# Patient Record
Sex: Male | Born: 1970 | Race: White | Hispanic: No
Health system: Southern US, Community
[De-identification: ages and names within clinical notes are randomized; demographics above are authoritative.]

## PROBLEM LIST (undated history)

## (undated) HISTORY — PX: OTHER SURGICAL HISTORY: SHX169

## (undated) HISTORY — PX: WISDOM TOOTH EXTRACTION: SHX21

## (undated) HISTORY — PX: BACK SURGERY: SHX140

---

## 2004-05-31 ENCOUNTER — Emergency Department: Payer: Self-pay | Admitting: Emergency Medicine

## 2008-04-01 ENCOUNTER — Emergency Department: Payer: Self-pay | Admitting: Internal Medicine

## 2009-08-16 ENCOUNTER — Emergency Department: Payer: Self-pay | Admitting: Emergency Medicine

## 2011-04-22 ENCOUNTER — Emergency Department: Payer: Self-pay | Admitting: Emergency Medicine

## 2018-07-20 ENCOUNTER — Emergency Department
Admission: EM | Admit: 2018-07-20 | Discharge: 2018-07-20 | Disposition: A | Discharge status: Home / Self Care | Payer: Self-pay | Attending: Emergency Medicine | Admitting: Emergency Medicine

## 2018-07-20 ENCOUNTER — Other Ambulatory Visit: Payer: Self-pay

## 2018-07-20 ENCOUNTER — Emergency Department: Payer: Self-pay

## 2018-07-20 DIAGNOSIS — K047 Periapical abscess without sinus: Secondary | ICD-10-CM | POA: Insufficient documentation

## 2018-07-20 DIAGNOSIS — M542 Cervicalgia: Secondary | ICD-10-CM | POA: Insufficient documentation

## 2018-07-20 DIAGNOSIS — S161XXA Strain of muscle, fascia and tendon at neck level, initial encounter: Secondary | ICD-10-CM

## 2018-07-20 LAB — CBC
HCT: 47.1 % (ref 39.0–52.0)
Hemoglobin: 15.9 g/dL (ref 13.0–17.0)
MCH: 29.1 pg (ref 26.0–34.0)
MCHC: 33.8 g/dL (ref 30.0–36.0)
MCV: 86.3 fL (ref 80.0–100.0)
Platelets: 290 10*3/uL (ref 150–400)
RBC: 5.46 MIL/uL (ref 4.22–5.81)
RDW: 12 % (ref 11.5–15.5)
WBC: 10.4 10*3/uL (ref 4.0–10.5)
nRBC: 0 % (ref 0.0–0.2)

## 2018-07-20 LAB — BASIC METABOLIC PANEL
Anion gap: 7 (ref 5–15)
BUN: 12 mg/dL (ref 6–20)
CO2: 30 mmol/L (ref 22–32)
Calcium: 9.2 mg/dL (ref 8.9–10.3)
Chloride: 99 mmol/L (ref 98–111)
Creatinine, Ser: 0.9 mg/dL (ref 0.61–1.24)
Glucose, Bld: 133 mg/dL — ABNORMAL HIGH (ref 70–99)
POTASSIUM: 4.4 mmol/L (ref 3.5–5.1)
Sodium: 136 mmol/L (ref 135–145)

## 2018-07-20 LAB — TROPONIN I

## 2018-07-20 MED ORDER — IBUPROFEN 800 MG PO TABS: 800.0000 mg | ORAL_TABLET | Freq: Three times a day (TID) | ORAL | 0 refills | Status: DC | PRN

## 2018-07-20 MED ORDER — CYCLOBENZAPRINE HCL 10 MG PO TABS: 10.0000 mg | ORAL_TABLET | Freq: Three times a day (TID) | ORAL | 0 refills | Status: DC | PRN

## 2018-07-20 MED ORDER — PENICILLIN V POTASSIUM 500 MG PO TABS
500.0000 mg | ORAL_TABLET | Freq: Once | ORAL | Status: AC
  Administered 2018-07-20: 500 mg via ORAL
  Filled 2018-07-20 (×2): qty 1

## 2018-07-20 MED ORDER — CYCLOBENZAPRINE HCL 10 MG PO TABS
10.0000 mg | ORAL_TABLET | Freq: Once | ORAL | Status: AC
  Administered 2018-07-20: 10 mg via ORAL
  Filled 2018-07-20: qty 1

## 2018-07-20 MED ORDER — IBUPROFEN 800 MG PO TABS
800.0000 mg | ORAL_TABLET | Freq: Once | ORAL | Status: AC
  Administered 2018-07-20: 800 mg via ORAL
  Filled 2018-07-20: qty 1

## 2018-07-20 MED ORDER — SODIUM CHLORIDE 0.9% FLUSH: 3.0000 mL | Freq: Once | INTRAVENOUS | Status: DC

## 2018-07-20 MED ORDER — PENICILLIN V POTASSIUM 500 MG PO TABS: 500.0000 mg | ORAL_TABLET | Freq: Four times a day (QID) | ORAL | 0 refills | Status: DC

## 2018-07-20 NOTE — ED Triage Notes (Addendum)
Pt comes in via POV from home with c/o jaw and neck pain that has been ongoing for 10 days. Pt states it got to hurting worse today  Pt states swelling noted to left side. Pt unsure if it is a tooth or not. Pt denies any bad teeth. Pt states he thinks it is what's causing the neck pain also.  Pt denies any chest pain, SOB. Pt also states weakness, disorientation, not eating and some nausea.

## 2018-07-20 NOTE — ED Provider Notes (Signed)
[facility] Emergency Department Provider Note       Time seen: ----------------------------------------- 2:13 PM on 07/20/2018 -----------------------------------------   I have reviewed the triage vital signs and the nursing notes.  HISTORY   Chief Complaint Neck Pain and Jaw Pain    HPI Peter Rivers is a 48 y.o. male with a history of back surgery, dental caries who presents to the ED for jaw and neck pain is been going on for 10 days.  Patient states he got to hurting worse today so he decided to come to the ER for evaluation.  Swelling is noted to the left side.  He denies chest pain, shortness of breath or other complaints to me.  History reviewed. No pertinent past medical history.  There are no active problems to display for this patient.   Past Surgical History:  Procedure Laterality Date  . BACK SURGERY    . cyst on left wrist    . WISDOM TOOTH EXTRACTION      Allergies Patient has no allergy information on record.  Social History Social History   Tobacco Use  . Smoking status: Not on file  Substance Use Topics  . Alcohol use: Not on file  . Drug use: Not on file   Review of Systems Constitutional: Negative for fever. ENT: Positive for jaw swelling, neck pain Cardiovascular: Negative for chest pain. Respiratory: Negative for shortness of breath. Gastrointestinal: Negative for abdominal pain, vomiting and diarrhea. Musculoskeletal: Negative for back pain. Skin: Negative for rash. Neurological: Negative for headaches, focal weakness or numbness.  All systems negative/normal/unremarkable except as stated in the HPI  ____________________________________________   PHYSICAL EXAM:  VITAL SIGNS: ED Triage Vitals  Enc Vitals Group     BP 07/20/18 1208 (!) 157/97     Pulse Rate 07/20/18 1208 70     Resp 07/20/18 1208 18     Temp 07/20/18 1208 98.1 F (36.7 C)     Temp src --      SpO2 07/20/18 1208 99 %     Weight  07/20/18 1205 185 lb (83.9 kg)     Height 07/20/18 1205 6\' 2"  (1.88 m)     Head Circumference --      Peak Flow --      Pain Score 07/20/18 1205 7     Pain Loc --      Pain Edu? --      Excl. in GC? --    Constitutional: Alert and oriented. Well appearing and in no distress. ENT      Head: Normocephalic and atraumatic.      Nose: No congestion/rhinnorhea.      Mouth/Throat: Mucous membranes are moist.  Obvious left mid perimandibular swelling and tenderness      Neck: No stridor.  No obvious adenopathy Cardiovascular: Normal rate, regular rhythm. No murmurs, rubs, or gallops. Respiratory: Normal respiratory effort without tachypnea nor retractions. Breath sounds are clear and equal bilaterally. No wheezes/rales/rhonchi. Musculoskeletal: Nontender with normal range of motion in extremities. No lower extremity tenderness nor edema. Neurologic:  Normal speech and language. No gross focal neurologic deficits are appreciated.  Skin:  Skin is warm, dry and intact. No rash noted. Psychiatric: Mood and affect are normal. Speech and behavior are normal.  ____________________________________________  EKG: Interpreted by me.  Sinus rhythm the rate of 74 bpm, normal PR interval, normal QRS, normal QT  ____________________________________________  ED COURSE:  As part of my medical decision making, I reviewed the following data  within the electronic MEDICAL RECORD NUMBER History obtained from family if available, nursing notes, old chart and ekg, as well as notes from prior ED visits. Patient presented for neck and jaw pain, we will assess with labs and imaging as indicated at this time.   Procedures ____________________________________________   LABS (pertinent positives/negatives)  Labs Reviewed  BASIC METABOLIC PANEL - Abnormal; Notable for the following components:      Result Value   Glucose, Bld 133 (*)    All other components within normal limits  CBC  TROPONIN I     RADIOLOGY  X-ray is unremarkable  ____________________________________________   DIFFERENTIAL DIAGNOSIS   Dental abscess, strain, spasm, degenerative disc disease  FINAL ASSESSMENT AND PLAN  Dental abscess, neck pain   Plan: The patient had presented for dental abscess with neck pain of uncertain etiology. Patient's labs are reassuring. Patient's imaging also reassuring.  He be discharged with Motrin, Flexeril and penicillin.  He be encouraged to follow-up with dentistry.   Ulice DashJohnathan E Williams, MD    Note: This note was generated in part or whole with voice recognition software. Voice recognition is usually quite accurate but there are transcription errors that can and very often do occur. I apologize for any typographical errors that were not detected and corrected.     Emily FilbertWilliams, Jonathan E, MD 07/20/18 1415

## 2020-08-13 IMAGING — CR DG CHEST 2V
1 series · 2 of 2 positions shown · non-contrast
Comparison: None.

CLINICAL DATA: Left-sided jaw pain for several days

EXAM:
CHEST - 2 VIEW

[Series 1: dg chest 2 view · 0.14mm/px · 2 of 2 slices shown]
[im 1/2]
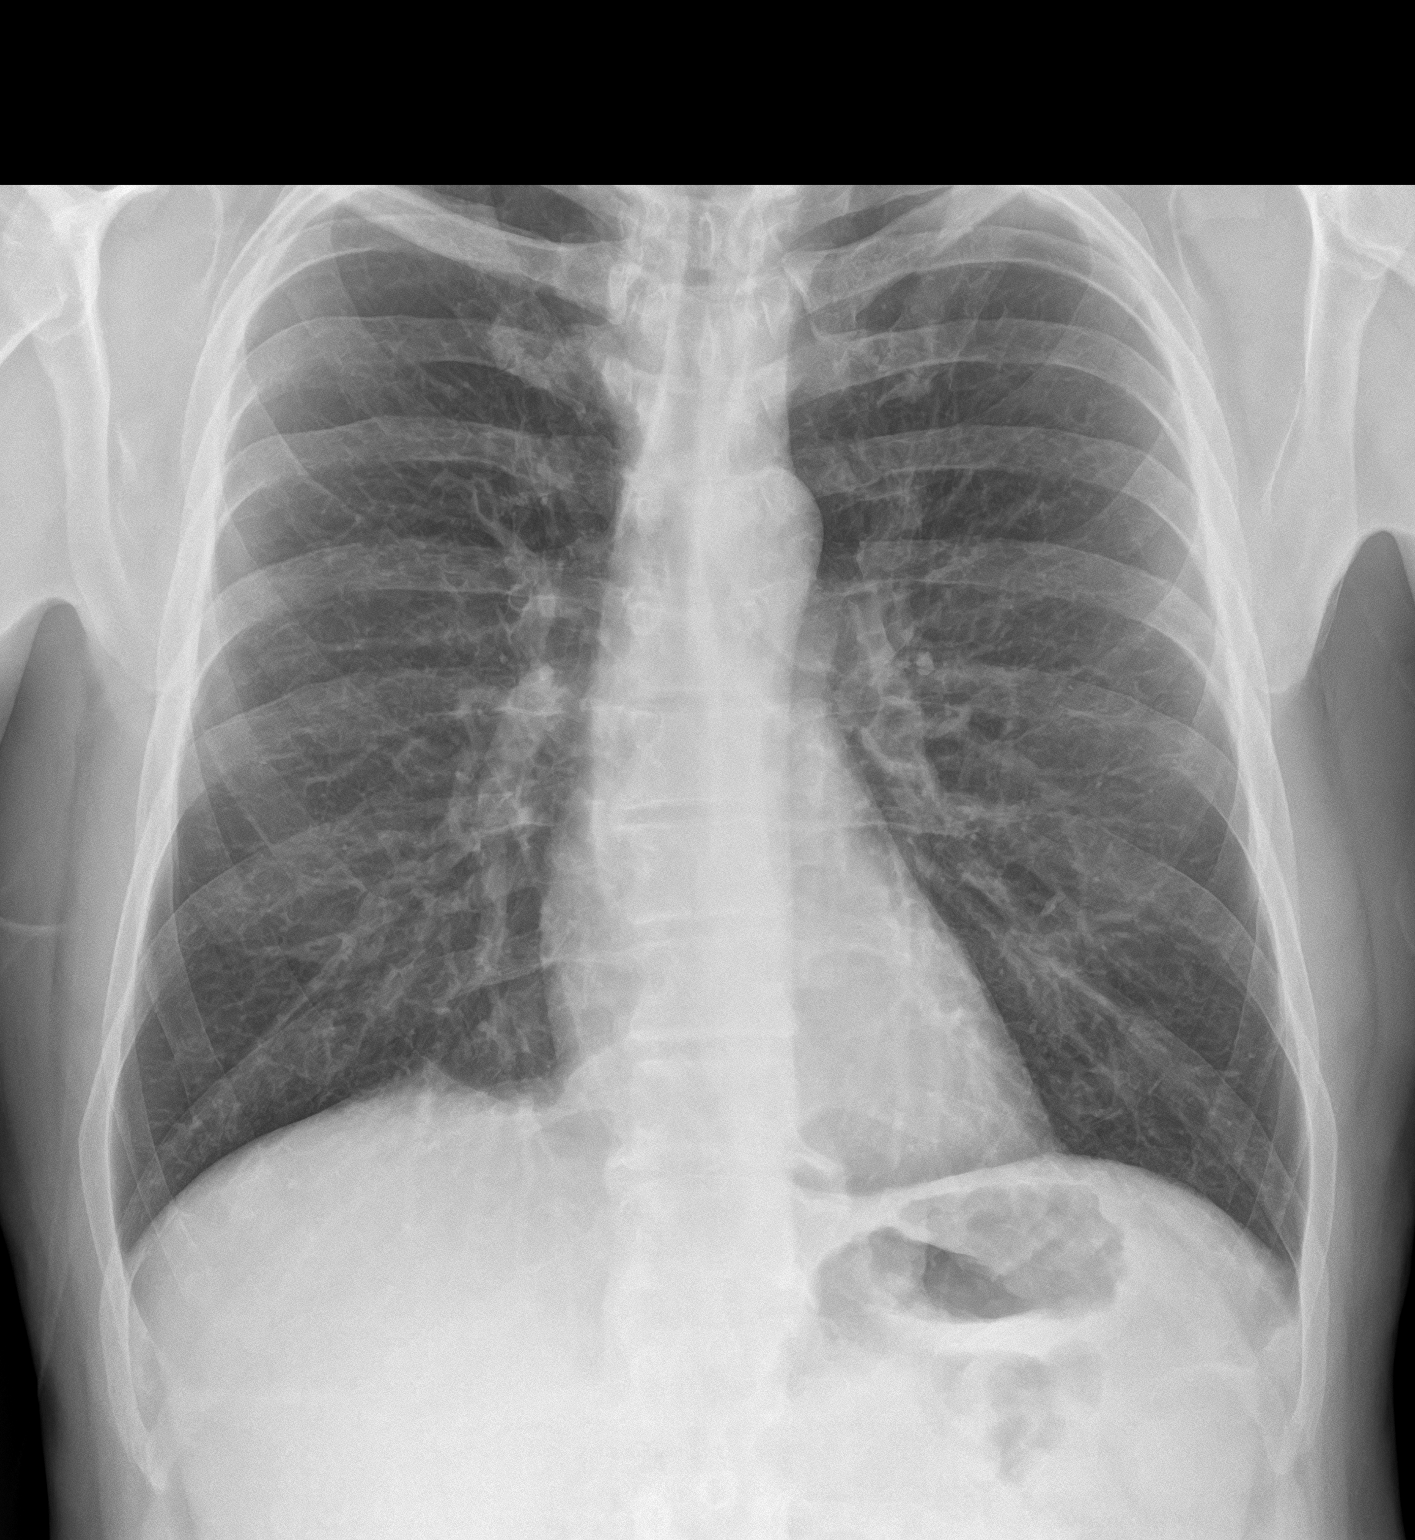
[im 2/2]
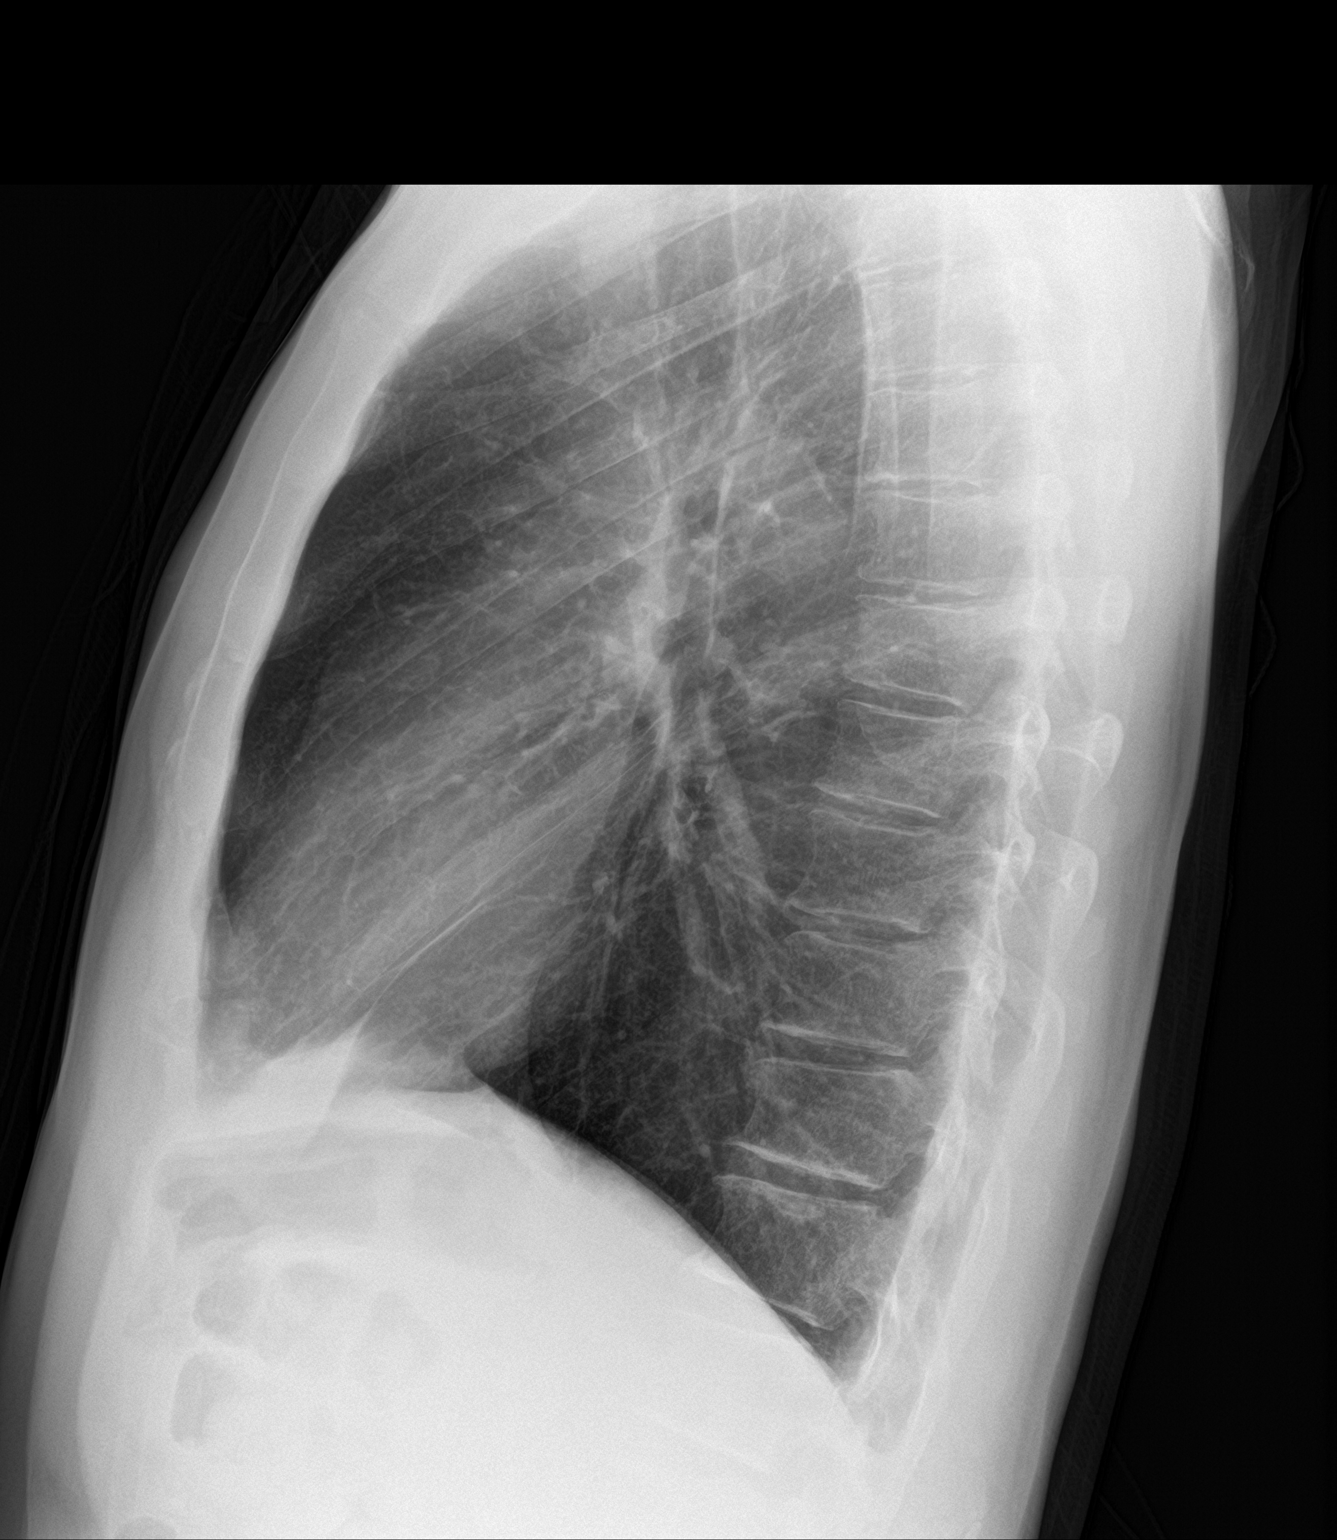

[2 of 2 positions shown; findings below may reference images not displayed]

FINDINGS: The heart size and mediastinal contours are within normal limits.
Both lungs are clear. The visualized skeletal structures are
unremarkable.
IMPRESSION: No active cardiopulmonary disease.

## 2021-11-23 ENCOUNTER — Other Ambulatory Visit: Payer: Self-pay

## 2021-11-23 ENCOUNTER — Emergency Department
Admission: EM | Admit: 2021-11-23 | Discharge: 2021-11-23 | Disposition: A | Discharge status: Home / Self Care | Payer: Self-pay | Attending: Emergency Medicine | Admitting: Emergency Medicine

## 2021-11-23 DIAGNOSIS — A64 Unspecified sexually transmitted disease: Secondary | ICD-10-CM | POA: Insufficient documentation

## 2021-11-23 LAB — URINALYSIS, COMPLETE (UACMP) WITH MICROSCOPIC
Bilirubin Urine: NEGATIVE
Glucose, UA: NEGATIVE mg/dL
Ketones, ur: NEGATIVE mg/dL
Nitrite: NEGATIVE
Protein, ur: NEGATIVE mg/dL
Specific Gravity, Urine: 1.008 (ref 1.005–1.030)
WBC, UA: >50 WBC/hpf — ABNORMAL HIGH (ref 0–5)
pH: 8 (ref 5.0–8.0)

## 2021-11-23 LAB — CHLAMYDIA/NGC RT PCR (ARMC ONLY)
Chlamydia Tr: NOT DETECTED
N gonorrhoeae: NOT DETECTED

## 2021-11-23 MED ORDER — AZITHROMYCIN 500 MG PO TABS
1000.0000 mg | ORAL_TABLET | Freq: Once | ORAL | Status: AC
  Administered 2021-11-23: 1000 mg via ORAL
  Filled 2021-11-23: qty 2

## 2021-11-23 MED ORDER — METRONIDAZOLE 500 MG PO TABS
2000.0000 mg | ORAL_TABLET | Freq: Once | ORAL | Status: AC
  Administered 2021-11-23: 2000 mg via ORAL
  Filled 2021-11-23: qty 4

## 2021-11-23 MED ORDER — CEFTRIAXONE SODIUM 1 G IJ SOLR
500.0000 mg | Freq: Once | INTRAMUSCULAR | Status: AC
  Administered 2021-11-23: 500 mg via INTRAMUSCULAR
  Filled 2021-11-23: qty 10

## 2021-11-23 MED ORDER — LIDOCAINE HCL (PF) 1 % IJ SOLN
5.0000 mL | Freq: Once | INTRAMUSCULAR | Status: AC
  Administered 2021-11-23: 5 mL
  Filled 2021-11-23: qty 5

## 2021-11-23 NOTE — ED Triage Notes (Signed)
Pt states that he has been having mucous in his urine for the past 3 days, denies pain with urination, reports recent separation from his wife

## 2021-11-23 NOTE — Discharge Instructions (Signed)
You are being treated for trichomoniasis, gonorrhea and chlamydia. You should not engage in unprotected sex with any partners, until all have been treated and are symptom free. You may follow-up with the [facility] for further testing and treatment.

## 2021-11-23 NOTE — ED Provider Triage Note (Signed)
Emergency Medicine Provider Triage Evaluation Note  Peter Rivers , a 51 y.o. male  was evaluated in triage.  Pt complains of penile discharge.  Describes mucus in his urine.  Not having any abdominal pain or fevers.  No testicular pain  Review of Systems  Positive: Fetal discharge Negative: Scrotal pain, fevers  Physical Exam  BP (!) 144/83 (BP Location: Left Arm)   Pulse 79   Temp 98.2 F (36.8 C) (Oral)   Resp 16   Ht 6\' 2"  (1.88 m)   Wt 81.6 kg   SpO2 98%   BMI 23.11 kg/m  Gen:   Awake, no distress   Resp:  Normal effort  MSK:   Moves extremities without difficulty  Other:    Medical Decision Making  Medically screening exam initiated at 5:10 PM.  Appropriate orders placed.  was informed that the remainder of the evaluation will be completed by another provider, this initial triage assessment does not replace that evaluation, and the importance of remaining in the ED until their evaluation is complete.     Laverle Patter, PA-C 11/23/21 1711

## 2021-11-23 NOTE — ED Provider Notes (Signed)
[facility] Emergency Department Provider Note     Event Date/Time   First MD Initiated Contact with Patient 11/23/21 1803     (approximate)   History   Penile Discharge   HPI  Peter Rivers is a 51 y.o. male with a noncontributory medical history, presents to the ED for 3 days of penile discharge.  Patient reports that yellow mucoid discharge from the penis. He denies any blood in urine or retention. He also denies any penile or scrotal lesions. He endorses unprotected anal, oral and vaginal intercourse, with a new partner. He denies FCS, NVD.     Physical Exam   Triage Vital Signs: ED Triage Vitals  Enc Vitals Group     BP 11/23/21 1707 (!) 144/83     Pulse Rate 11/23/21 1707 79     Resp 11/23/21 1707 16     Temp 11/23/21 1707 98.2 F (36.8 C)     Temp Source 11/23/21 1707 Oral     SpO2 11/23/21 1707 98 %     Weight 11/23/21 1709 180 lb (81.6 kg)     Height 11/23/21 1709 6\' 2"  (1.88 m)     Head Circumference --      Peak Flow --      Pain Score 11/23/21 1709 0     Pain Loc --      Pain Edu? --      Excl. in GC? --     Most recent vital signs: Vitals:   11/23/21 1707  BP: (!) 144/83  Pulse: 79  Resp: 16  Temp: 98.2 F (36.8 C)  SpO2: 98%    General Awake, no distress.  CV:  Good peripheral perfusion.  RESP:  Normal effort.  ABD:  No distention.  GU:  deferred   ED Results / Procedures / Treatments   Labs (all labs ordered are listed, but only abnormal results are displayed) Labs Reviewed  URINALYSIS, COMPLETE (UACMP) WITH MICROSCOPIC - Abnormal; Notable for the following components:      Result Value   Color, Urine YELLOW (*)    APPearance CLOUDY (*)    Hgb urine dipstick SMALL (*)    Leukocytes,Ua LARGE (*)    WBC, UA >50 (*)    Bacteria, UA RARE (*)    All other components within normal limits  CHLAMYDIA/NGC RT PCR ([facility] ONLY)               EKG    RADIOLOGY    No results  found.   PROCEDURES:  Critical Care performed: No  Procedures   MEDICATIONS ORDERED IN ED: Medications  metroNIDAZOLE (FLAGYL) tablet 2,000 mg (2,000 mg Oral Given 11/23/21 1841)  cefTRIAXone (ROCEPHIN) injection 500 mg (500 mg Intramuscular Given 11/23/21 1841)  azithromycin (ZITHROMAX) tablet 1,000 mg (1,000 mg Oral Given 11/23/21 1841)  lidocaine (PF) (XYLOCAINE) 1 % injection 5 mL (5 mLs Infiltration Given 11/23/21 1844)     IMPRESSION / MDM / ASSESSMENT AND PLAN / ED COURSE  I reviewed the triage vital signs and the nursing notes.                              Differential diagnosis includes, but is not limited to, gonorrhea, chlamydia, trichomoniasis, NGU, UTI  Patient to the ED for evaluation of penile discharge after admitting to after intercourse related to new partners.  Patient's diagnosis is consistent with STD exposure. Patient will be  treated empirically for gonorrhea chlamydia and trichomoniasis in the ED. Patient is to follow up with [facility], health department as needed or otherwise directed. Patient is given ED precautions to return to the ED for any worsening or new symptoms.   Patient's presentation is most consistent with acute, uncomplicated illness.  FINAL CLINICAL IMPRESSION(S) / ED DIAGNOSES   Final diagnoses:  Sexually transmitted disease (STD)     Rx / DC Orders   ED Discharge Orders     None        Note:  This document was prepared using Dragon voice recognition software and may include unintentional dictation errors.    Lissa Hoard, PA-C 11/23/21 1858    Delton Prairie, MD 11/23/21 [phone]

## 2022-05-25 DIAGNOSIS — Z419 Encounter for procedure for purposes other than remedying health state, unspecified: Secondary | ICD-10-CM | POA: Diagnosis not present

## 2022-06-25 DIAGNOSIS — Z419 Encounter for procedure for purposes other than remedying health state, unspecified: Secondary | ICD-10-CM | POA: Diagnosis not present

## 2022-07-26 DIAGNOSIS — Z419 Encounter for procedure for purposes other than remedying health state, unspecified: Secondary | ICD-10-CM | POA: Diagnosis not present

## 2022-08-24 DIAGNOSIS — Z419 Encounter for procedure for purposes other than remedying health state, unspecified: Secondary | ICD-10-CM | POA: Diagnosis not present

## 2022-08-29 ENCOUNTER — Telehealth: Payer: Self-pay

## 2022-08-29 NOTE — Telephone Encounter (Signed)
Mychart msg sent

## 2022-09-24 DIAGNOSIS — Z419 Encounter for procedure for purposes other than remedying health state, unspecified: Secondary | ICD-10-CM | POA: Diagnosis not present

## 2022-10-24 DIAGNOSIS — Z419 Encounter for procedure for purposes other than remedying health state, unspecified: Secondary | ICD-10-CM | POA: Diagnosis not present

## 2022-11-24 DIAGNOSIS — Z419 Encounter for procedure for purposes other than remedying health state, unspecified: Secondary | ICD-10-CM | POA: Diagnosis not present

## 2022-12-24 DIAGNOSIS — Z419 Encounter for procedure for purposes other than remedying health state, unspecified: Secondary | ICD-10-CM | POA: Diagnosis not present

## 2023-01-24 DIAGNOSIS — Z419 Encounter for procedure for purposes other than remedying health state, unspecified: Secondary | ICD-10-CM | POA: Diagnosis not present

## 2023-02-24 DIAGNOSIS — Z419 Encounter for procedure for purposes other than remedying health state, unspecified: Secondary | ICD-10-CM | POA: Diagnosis not present

## 2023-03-26 DIAGNOSIS — Z419 Encounter for procedure for purposes other than remedying health state, unspecified: Secondary | ICD-10-CM | POA: Diagnosis not present

## 2023-04-26 DIAGNOSIS — Z419 Encounter for procedure for purposes other than remedying health state, unspecified: Secondary | ICD-10-CM | POA: Diagnosis not present

## 2023-05-26 DIAGNOSIS — Z419 Encounter for procedure for purposes other than remedying health state, unspecified: Secondary | ICD-10-CM | POA: Diagnosis not present

## 2023-06-26 DIAGNOSIS — Z419 Encounter for procedure for purposes other than remedying health state, unspecified: Secondary | ICD-10-CM | POA: Diagnosis not present

## 2023-07-27 DIAGNOSIS — Z419 Encounter for procedure for purposes other than remedying health state, unspecified: Secondary | ICD-10-CM | POA: Diagnosis not present

## 2023-08-24 DIAGNOSIS — Z419 Encounter for procedure for purposes other than remedying health state, unspecified: Secondary | ICD-10-CM | POA: Diagnosis not present

## 2023-10-05 DIAGNOSIS — Z419 Encounter for procedure for purposes other than remedying health state, unspecified: Secondary | ICD-10-CM | POA: Diagnosis not present

## 2023-11-04 DIAGNOSIS — Z419 Encounter for procedure for purposes other than remedying health state, unspecified: Secondary | ICD-10-CM | POA: Diagnosis not present

## 2023-12-05 DIAGNOSIS — Z419 Encounter for procedure for purposes other than remedying health state, unspecified: Secondary | ICD-10-CM | POA: Diagnosis not present

## 2024-01-04 DIAGNOSIS — Z419 Encounter for procedure for purposes other than remedying health state, unspecified: Secondary | ICD-10-CM | POA: Diagnosis not present

## 2024-02-04 DIAGNOSIS — Z419 Encounter for procedure for purposes other than remedying health state, unspecified: Secondary | ICD-10-CM | POA: Diagnosis not present

## 2024-03-06 DIAGNOSIS — Z419 Encounter for procedure for purposes other than remedying health state, unspecified: Secondary | ICD-10-CM | POA: Diagnosis not present

## 2024-04-02 ENCOUNTER — Ambulatory Visit

## 2024-04-02 ENCOUNTER — Emergency Department
Admission: EM | Admit: 2024-04-02 | Discharge: 2024-04-02 | Disposition: A | Discharge status: Home / Self Care | Attending: Emergency Medicine | Admitting: Emergency Medicine

## 2024-04-02 ENCOUNTER — Other Ambulatory Visit: Payer: Self-pay

## 2024-04-02 ENCOUNTER — Encounter: Payer: Self-pay | Admitting: Emergency Medicine

## 2024-04-02 DIAGNOSIS — N342 Other urethritis: Secondary | ICD-10-CM | POA: Insufficient documentation

## 2024-04-02 DIAGNOSIS — R369 Urethral discharge, unspecified: Secondary | ICD-10-CM | POA: Diagnosis present

## 2024-04-02 DIAGNOSIS — Z711 Person with feared health complaint in whom no diagnosis is made: Secondary | ICD-10-CM

## 2024-04-02 DIAGNOSIS — Z202 Contact with and (suspected) exposure to infections with a predominantly sexual mode of transmission: Secondary | ICD-10-CM | POA: Diagnosis not present

## 2024-04-02 LAB — URINALYSIS, W/ REFLEX TO CULTURE (INFECTION SUSPECTED)
Bilirubin Urine: NEGATIVE
Glucose, UA: NEGATIVE mg/dL
Hgb urine dipstick: NEGATIVE
Ketones, ur: NEGATIVE mg/dL
Nitrite: NEGATIVE
Protein, ur: NEGATIVE mg/dL
Specific Gravity, Urine: 1.008 (ref 1.005–1.030)
pH: 7 (ref 5.0–8.0)

## 2024-04-02 LAB — CHLAMYDIA/NGC RT PCR (ARMC ONLY)
Chlamydia Tr: NOT DETECTED
N gonorrhoeae: NOT DETECTED

## 2024-04-02 MED ORDER — CEFDINIR 300 MG PO CAPS: 300.0000 mg | ORAL_CAPSULE | Freq: Two times a day (BID) | ORAL | 0 refills | Status: AC

## 2024-04-02 NOTE — ED Triage Notes (Signed)
 Pt reports penile discharge since yesterday. Denies rash or pain with urination.

## 2024-04-02 NOTE — Discharge Instructions (Addendum)
Please take the antibiotics as prescribed.  Please return for any new, worsening, or changing symptoms or other concerns.  It was a pleasure caring for you today.

## 2024-04-02 NOTE — ED Provider Notes (Signed)
 [facility] Provider Note    Event Date/Time   First MD Initiated Contact with Patient 04/02/24 1205     (approximate)   History   Exposure to STD   HPI  Peter Rivers is a 53 y.o. male who presents today for evaluation of penile discharge.  Patient reports that this began yesterday.  He reports that he was most recently sexually active a couple of days ago.  He reports that his partner is not having similar symptoms.  No abdominal pain, burning with urination, blood in his urine, fevers, or chills.  He reports that he has not noticed any swelling or skin lesions.  There are no active problems to display for this patient.         Physical Exam   Triage Vital Signs: ED Triage Vitals [04/02/24 1134]  Encounter Vitals Group     BP (!) 180/103     Girls Systolic BP Percentile      Girls Diastolic BP Percentile      Boys Systolic BP Percentile      Boys Diastolic BP Percentile      Pulse Rate 65     Resp 16     Temp 97.6 F (36.4 C)     Temp Source Oral     SpO2 97 %     Weight 178 lb 9.2 oz (81 kg)     Height 6' 2 (1.88 m)     Head Circumference      Peak Flow      Pain Score 0     Pain Loc      Pain Education      Exclude from Growth Chart     Most recent vital signs: Vitals:   04/02/24 1134 04/02/24 1327  BP: (!) 180/103 (!) 155/99  Pulse: 65   Resp: 16   Temp: 97.6 F (36.4 C)   SpO2: 97%     Physical Exam Vitals and nursing note reviewed.  Constitutional:      General: Awake and alert. No acute distress.    Appearance: Normal appearance. The patient is normal weight.  HENT:     Head: Normocephalic and atraumatic.     Mouth: Mucous membranes are moist.  Eyes:     General: PERRL. Normal EOMs        Right eye: No discharge.        Left eye: No discharge.     Conjunctiva/sclera: Conjunctivae normal.  Cardiovascular:     Rate and Rhythm: Normal rate and regular rhythm.     Pulses: Normal pulses.  Pulmonary:      Effort: Pulmonary effort is normal. No respiratory distress.     Breath sounds: Normal breath sounds.  Abdominal:     Abdomen is soft. There is no abdominal tenderness. No rebound or guarding. No distention. Musculoskeletal:        General: No swelling. Normal range of motion.     Cervical back: Normal range of motion and neck supple.  Skin:    General: Skin is warm and dry.     Capillary Refill: Capillary refill takes less than 2 seconds.     Findings: No rash.  Neurological:     Mental Status: The patient is awake and alert.      ED Results / Procedures / Treatments   Labs (all labs ordered are listed, but only abnormal results are displayed) Labs Reviewed  URINALYSIS, W/ REFLEX TO CULTURE (INFECTION SUSPECTED) - Abnormal;  Notable for the following components:      Result Value   Color, Urine STRAW (*)    APPearance CLOUDY (*)    Leukocytes,Ua LARGE (*)    Bacteria, UA RARE (*)    All other components within normal limits  CHLAMYDIA/NGC RT PCR ([facility] ONLY)            URINE CULTURE  RPR  MISC [facility] TEST (SEND OUT)     EKG     RADIOLOGY     PROCEDURES:  Critical Care performed:   Procedures   MEDICATIONS ORDERED IN ED: Medications - No data to display   IMPRESSION / MDM / ASSESSMENT AND PLAN / ED COURSE  I reviewed the triage vital signs and the nursing notes.   Differential diagnosis includes, but is not limited to, urethritis, UTI, STD.  Patient is awake and alert, hemodynamically stable and afebrile.  Patient declined GU exam.  Urinalysis does reveal leukocytes suggestive of infection.  GC/chlamydia is negative.  Patient's partner was tested for trichomonas and this was negative.  Will treat for UTI though urine was sent for culture.  Discussed antibiotics in the meantime.  Also advised that the HIV/syphilis test will result tomorrow and that he can find this on MyChart.  Patient was advised that there are many other STDs that patient could have,  that we do not test for in the emergency department. Patient was advised to follow up with a primary care doctor to have the full panel of testing performed. Patient was advised to not have sexual intercourse until fully and properly tested and treated. Patient was advised that his partner also needs to be tested and treated. Patient understands and agrees with plan.  Patient was discharged in stable condition.  Patient's presentation is most consistent with acute complicated illness / injury requiring diagnostic workup.    FINAL CLINICAL IMPRESSION(S) / ED DIAGNOSES   Final diagnoses:  Concern about STD in male without diagnosis  Urethritis     Rx / DC Orders   ED Discharge Orders          Ordered    cefdinir (OMNICEF) 300 MG capsule  2 times daily        04/02/24 1426             Note:  This document was prepared using Dragon voice recognition software and may include unintentional dictation errors.   Iliyah Bui E, PA-C 04/02/24 1428    Viviann Pastor, MD 04/03/24 [phone]

## 2024-04-03 LAB — URINE CULTURE: Culture: NO GROWTH

## 2024-04-03 LAB — MISC LABCORP TEST (SEND OUT): Labcorp test code: 83935

## 2024-04-03 LAB — RPR: RPR Ser Ql: NONREACTIVE

## 2024-05-14 DIAGNOSIS — R300 Dysuria: Secondary | ICD-10-CM | POA: Diagnosis not present

## 2024-05-14 DIAGNOSIS — N342 Other urethritis: Secondary | ICD-10-CM | POA: Diagnosis not present
